# Patient Record
Sex: Male | Born: 1980 | Race: White | Hispanic: No | Smoking: Never smoker
Health system: Southern US, Community
[De-identification: ages and names within clinical notes are randomized; demographics above are authoritative.]

## PROBLEM LIST (undated history)

## (undated) DIAGNOSIS — J320 Chronic maxillary sinusitis: Secondary | ICD-10-CM

## (undated) DIAGNOSIS — J322 Chronic ethmoidal sinusitis: Secondary | ICD-10-CM

## (undated) DIAGNOSIS — J343 Hypertrophy of nasal turbinates: Secondary | ICD-10-CM

## (undated) HISTORY — PX: NO PAST SURGERIES: SHX2092

---

## 2002-01-19 ENCOUNTER — Emergency Department (HOSPITAL_COMMUNITY): Admission: EM | Admit: 2002-01-19 | Discharge: 2002-01-19 | Payer: Self-pay | Admitting: Internal Medicine

## 2002-10-04 ENCOUNTER — Encounter: Payer: Self-pay | Admitting: Emergency Medicine

## 2002-10-04 ENCOUNTER — Emergency Department (HOSPITAL_COMMUNITY): Admission: EM | Admit: 2002-10-04 | Discharge: 2002-10-04 | Payer: Self-pay | Admitting: Emergency Medicine

## 2004-06-21 ENCOUNTER — Emergency Department (HOSPITAL_COMMUNITY): Admission: EM | Admit: 2004-06-21 | Discharge: 2004-06-21 | Payer: Self-pay | Admitting: Emergency Medicine

## 2005-07-31 ENCOUNTER — Emergency Department (HOSPITAL_COMMUNITY): Admission: EM | Admit: 2005-07-31 | Discharge: 2005-07-31 | Payer: Self-pay | Admitting: Emergency Medicine

## 2005-12-29 ENCOUNTER — Emergency Department (HOSPITAL_COMMUNITY): Admission: EM | Admit: 2005-12-29 | Discharge: 2005-12-29 | Payer: Self-pay | Admitting: Emergency Medicine

## 2006-09-09 ENCOUNTER — Emergency Department (HOSPITAL_COMMUNITY): Admission: EM | Admit: 2006-09-09 | Discharge: 2006-09-09 | Payer: Self-pay | Admitting: Emergency Medicine

## 2007-09-16 ENCOUNTER — Emergency Department (HOSPITAL_COMMUNITY): Admission: EM | Admit: 2007-09-16 | Discharge: 2007-09-16 | Payer: Self-pay | Admitting: Emergency Medicine

## 2007-12-11 ENCOUNTER — Emergency Department (HOSPITAL_COMMUNITY): Admission: EM | Admit: 2007-12-11 | Discharge: 2007-12-11 | Payer: Self-pay | Admitting: Emergency Medicine

## 2008-10-31 ENCOUNTER — Emergency Department (HOSPITAL_COMMUNITY): Admission: EM | Admit: 2008-10-31 | Discharge: 2008-10-31 | Payer: Self-pay | Admitting: Emergency Medicine

## 2010-03-31 ENCOUNTER — Emergency Department (HOSPITAL_COMMUNITY): Admission: EM | Admit: 2010-03-31 | Discharge: 2010-03-31 | Payer: Self-pay | Admitting: Emergency Medicine

## 2010-07-23 ENCOUNTER — Emergency Department (HOSPITAL_COMMUNITY)
Admission: EM | Admit: 2010-07-23 | Discharge: 2010-07-23 | Payer: Self-pay | Source: Home / Self Care | Admitting: Emergency Medicine

## 2010-08-05 ENCOUNTER — Emergency Department (HOSPITAL_COMMUNITY): Admission: EM | Admit: 2010-08-05 | Discharge: 2010-08-05 | Payer: Self-pay | Admitting: Emergency Medicine

## 2010-11-18 ENCOUNTER — Emergency Department (HOSPITAL_COMMUNITY): Payer: BC Managed Care – PPO

## 2010-11-18 ENCOUNTER — Emergency Department (HOSPITAL_COMMUNITY)
Admission: EM | Admit: 2010-11-18 | Discharge: 2010-11-18 | Disposition: A | Discharge status: Home / Self Care | Payer: BC Managed Care – PPO | Attending: Emergency Medicine | Admitting: Emergency Medicine

## 2010-11-18 DIAGNOSIS — R071 Chest pain on breathing: Secondary | ICD-10-CM | POA: Insufficient documentation

## 2011-01-14 ENCOUNTER — Emergency Department (HOSPITAL_COMMUNITY)
Admission: EM | Admit: 2011-01-14 | Discharge: 2011-01-14 | Disposition: A | Discharge status: Home / Self Care | Payer: BC Managed Care – PPO | Attending: Emergency Medicine | Admitting: Emergency Medicine

## 2011-01-14 DIAGNOSIS — L255 Unspecified contact dermatitis due to plants, except food: Secondary | ICD-10-CM | POA: Insufficient documentation

## 2011-01-14 DIAGNOSIS — T622X1A Toxic effect of other ingested (parts of) plant(s), accidental (unintentional), initial encounter: Secondary | ICD-10-CM | POA: Insufficient documentation

## 2011-09-16 ENCOUNTER — Emergency Department (HOSPITAL_COMMUNITY)
Admission: EM | Admit: 2011-09-16 | Discharge: 2011-09-16 | Disposition: A | Discharge status: Home / Self Care | Payer: BC Managed Care – PPO | Attending: Emergency Medicine | Admitting: Emergency Medicine

## 2011-09-16 ENCOUNTER — Encounter: Payer: Self-pay | Admitting: *Deleted

## 2011-09-16 DIAGNOSIS — S61409A Unspecified open wound of unspecified hand, initial encounter: Secondary | ICD-10-CM | POA: Insufficient documentation

## 2011-09-16 DIAGNOSIS — IMO0002 Reserved for concepts with insufficient information to code with codable children: Secondary | ICD-10-CM

## 2011-09-16 DIAGNOSIS — W268XXA Contact with other sharp object(s), not elsewhere classified, initial encounter: Secondary | ICD-10-CM | POA: Insufficient documentation

## 2011-09-16 MED ORDER — SULFAMETHOXAZOLE-TRIMETHOPRIM 800-160 MG PO TABS: 1.0000 | ORAL_TABLET | Freq: Two times a day (BID) | ORAL | Status: AC

## 2011-09-16 NOTE — ED Notes (Signed)
Lac to rt hand on Sunday when working on car.

## 2011-09-16 NOTE — ED Notes (Signed)
Dc to home

## 2011-09-17 NOTE — ED Provider Notes (Signed)
Medical screening examination/treatment/procedure(s) were performed by non-physician practitioner and as supervising physician I was immediately available for consultation/collaboration.  Nicholes Stairs, MD 09/17/11 [phone]

## 2011-09-17 NOTE — ED Provider Notes (Signed)
History     CSN: [id number]  Arrival date & time 09/16/11  1214   First MD Initiated Contact with Patient 09/16/11 1313      Chief Complaint  Patient presents with  . Laceration    (Consider location/radiation/quality/duration/timing/severity/associated sxs/prior treatment) Patient is a 31 y.o. male presenting with skin laceration. The history is provided by the patient.  Laceration  The incident occurred 2 days ago. The laceration is located on the right hand. The laceration is 3 cm in size. Injury mechanism: He cut his hand on a sharp car motor edge while working on his car. The pain is at a severity of 2/10. The pain is mild. The pain has been constant (He presents due to increased swelling of the area around this laceration) since onset. He reports no foreign bodies present. His tetanus status is UTD.    History reviewed. No pertinent past medical history.  History reviewed. No pertinent past surgical history.  Family History  Problem Relation Age of Onset  . Diabetes Father     History  Substance Use Topics  . Smoking status: Never Smoker   . Smokeless tobacco: Not on file  . Alcohol Use: Yes      Review of Systems  Constitutional: Negative for fever.  HENT: Negative for congestion, sore throat and neck pain.   Eyes: Negative.   Respiratory: Negative for chest tightness and shortness of breath.   Cardiovascular: Negative for chest pain.  Gastrointestinal: Negative for nausea and abdominal pain.  Genitourinary: Negative.   Musculoskeletal: Negative for joint swelling and arthralgias.  Skin: Positive for color change and wound. Negative for rash.  Neurological: Negative for dizziness, weakness, light-headedness, numbness and headaches.  Hematological: Negative.   Psychiatric/Behavioral: Negative.     Allergies  Review of patient's allergies indicates no known allergies.  Home Medications   Current Outpatient Rx  Name Route Sig Dispense Refill  .  ACETAMINOPHEN 500 MG PO TABS Oral Take 1,000 mg by mouth every 6 (six) hours as needed. For pain     . SULFAMETHOXAZOLE-TRIMETHOPRIM 800-160 MG PO TABS Oral Take 1 tablet by mouth 2 (two) times daily. 20 tablet 0    BP 125/72  Pulse 83  Temp(Src) 97.9 F (36.6 C) (Oral)  Resp 20  Ht 6' (1.829 m)  Wt 170 lb (77.111 kg)  BMI 23.06 kg/m2  SpO2 100%  Physical Exam  Nursing note and vitals reviewed. Constitutional: He is oriented to person, place, and time. He appears well-developed and well-nourished.  HENT:  Head: Normocephalic and atraumatic.  Eyes: Conjunctivae are normal.  Neck: Neck supple.  Cardiovascular: Normal rate and intact distal pulses.   Pulmonary/Chest: Effort normal. No respiratory distress.  Musculoskeletal: Normal range of motion.  Neurological: He is alert and oriented to person, place, and time.  Skin: Skin is warm and dry. There is erythema.       3 cm irregular subcutaneous laceration right dorsal hand near base of 4th finger,  Relatively well approximated when pt does not flex fingers.  He displays full flexion and extension of fingers without difficulty or weakness.  Slight edema and faint erythema 3 cm surrounding laceration consistent with possible early hand infection.  Psychiatric: He has a normal mood and affect.    ED Course  LACERATION REPAIR Performed by: Ronnie Doo L Authorized by: Candis Musa Consent: Verbal consent obtained. Risks and benefits: risks, benefits and alternatives were discussed Consent given by: patient Body area: upper extremity Location details: right hand  Laceration length: 3 cm Foreign bodies: no foreign bodies Tendon involvement: none Vascular damage: no Irrigation solution: saline (Wound cleansing spray also used) Amount of cleaning: standard Debridement: none Skin closure: Steri-Strips Approximation: close Dressing: 4x4 sterile gauze Patient tolerance: Patient tolerated the procedure well with no immediate  complications. Comments: Finger splint applied to volar 4th digit to prevent stress to wound edges.  Patient advised to return in 2 days for recheck if sx are not improving or if he develops worse swelling,  Redness,  Streaking,  Etc.   (including critical care time)  Labs Reviewed - No data to display No results found.   1. Laceration       MDM    Septra Ds prescribed.      Candis Musa, PA 09/17/11 0629  Candis Musa, PA 09/17/11 [phone]

## 2014-06-27 ENCOUNTER — Emergency Department (HOSPITAL_COMMUNITY)
Admission: EM | Admit: 2014-06-27 | Discharge: 2014-06-27 | Disposition: A | Discharge status: Home / Self Care | Payer: BC Managed Care – PPO | Attending: Emergency Medicine | Admitting: Emergency Medicine

## 2014-06-27 ENCOUNTER — Encounter (HOSPITAL_COMMUNITY): Payer: Self-pay | Admitting: Emergency Medicine

## 2014-06-27 DIAGNOSIS — Z7952 Long term (current) use of systemic steroids: Secondary | ICD-10-CM | POA: Diagnosis not present

## 2014-06-27 DIAGNOSIS — B356 Tinea cruris: Secondary | ICD-10-CM | POA: Insufficient documentation

## 2014-06-27 DIAGNOSIS — Z79899 Other long term (current) drug therapy: Secondary | ICD-10-CM | POA: Insufficient documentation

## 2014-06-27 DIAGNOSIS — R21 Rash and other nonspecific skin eruption: Secondary | ICD-10-CM | POA: Diagnosis present

## 2014-06-27 MED ORDER — CLOTRIMAZOLE-BETAMETHASONE 1-0.05 % EX LOTN: TOPICAL_LOTION | Freq: Two times a day (BID) | CUTANEOUS | Status: DC

## 2014-06-27 NOTE — ED Notes (Signed)
Pt with rash that comes and goes for months, pt does admit to switching detergents frequently, states area burns

## 2014-06-27 NOTE — ED Provider Notes (Signed)
CSN: [id number]     Arrival date & time 06/27/14  1614 History   First MD Initiated Contact with Patient 06/27/14 1634     Chief Complaint  Patient presents with  . Rash     (Consider location/radiation/quality/duration/timing/severity/associated sxs/prior Treatment) Patient is a 33 y.o. male presenting with rash. The history is provided by the patient.  Rash Location:  Ano-genital Ano-genital rash location:  Groin Quality: burning, itchiness, redness, scaling and weeping   Quality: not blistering, not draining and not painful   Severity:  Moderate Onset quality:  Gradual Duration:  3 days Timing:  Constant Progression:  Spreading Chronicity:  Recurrent Context: not chemical exposure, not exposure to similar rash, not hot tub use, not medications and not new detergent/soap   Relieved by:  Nothing Worsened by:  Heat Ineffective treatments:  Topical steroids Associated symptoms: no abdominal pain, no fever, no headaches, no nausea, no shortness of breath, no sore throat and not wheezing     History reviewed. No pertinent past medical history. History reviewed. No pertinent past surgical history. Family History  Problem Relation Age of Onset  . Diabetes Father    History  Substance Use Topics  . Smoking status: Never Smoker   . Smokeless tobacco: Not on file  . Alcohol Use: Yes    Review of Systems  Constitutional: Negative for fever and chills.  HENT: Negative for sore throat.   Respiratory: Negative for shortness of breath and wheezing.   Gastrointestinal: Negative for nausea and abdominal pain.  Skin: Positive for rash.  Neurological: Negative for numbness and headaches.      Allergies  Review of patient's allergies indicates no known allergies.  Home Medications   Prior to Admission medications   Medication Sig Start Date End Date Taking? Authorizing Provider  triamcinolone cream (KENALOG) 0.1 % Apply 1 application topically daily. 06/07/14  Yes Historical  Provider, MD  clotrimazole-betamethasone (LOTRISONE) lotion Apply topically 2 (two) times daily. 06/27/14   Burgess AmorJulie Breckon Reeves, PA-C   BP 128/82  Pulse 88  Temp(Src) 98.2 F (36.8 C) (Oral)  Resp 20  Ht 6' (1.829 m)  Wt 179 lb (81.194 kg)  BMI 24.27 kg/m2  SpO2 100% Physical Exam  Constitutional: He appears well-developed and well-nourished. No distress.  HENT:  Head: Normocephalic.  Neck: Neck supple.  Cardiovascular: Normal rate.   Pulmonary/Chest: Effort normal. He has no wheezes.  Musculoskeletal: Normal range of motion. He exhibits no edema.  Skin: Rash noted.  Erythematous rash, raised,  Papular, scaly,  satellite lesions bilateral upper medial thighs.    ED Course  Procedures (including critical care time) Labs Review Labs Reviewed - No data to display  Imaging Review No results found.   EKG Interpretation None      MDM   Final diagnoses:  Tinea cruris    PT is currently using triamcinolone cream for a rash on his hands suspected to be eczema.  He has tried this on his new groin rash which has not improved,  States he feels it makes it Freight forwarderitchier.  Suspect fungal infection.  lotrisone lotion.  Plan f/u with pcp for a recheck if not starting to improve over the next week.    Burgess AmorJulie Jaymarion Trombly, PA-C 06/27/14 [phone]

## 2014-06-27 NOTE — ED Provider Notes (Signed)
Medical screening examination/treatment/procedure(s) were performed by non-physician practitioner and as supervising physician I was immediately available for consultation/collaboration.   EKG Interpretation None       Wynne Jury R. Elona Yinger, MD 06/27/14 2344 

## 2014-06-27 NOTE — Discharge Instructions (Signed)
Jock Itch Jock itch is a fungal infection of the skin in the groin area. It is sometimes called "ringworm" even though it is not caused by a worm. A fungus is a type of germ that thrives in dark, damp places.  CAUSES  This infection may spread from:  A fungus infection elsewhere on the body (such as athlete's foot).  Sharing towels or clothing. This infection is more common in:  Hot, humid climates.  People who wear tight-fitting clothing or wet bathing suits for long periods of time.  Athletes.  Overweight people.  People with diabetes. SYMPTOMS  Jock itch causes the following symptoms:  Red, pink or brown rash in the groin. Rash may spread to the thighs, anus, and buttocks.  Itching. DIAGNOSIS  Your caregiver may make the diagnosis by looking at the rash. Sometimes a skin scraping will be sent to test for fungus. Testing can be done either by looking under the microscope or by doing a culture (test to try to grow the fungus). A culture can take up to 2 weeks to come back. TREATMENT  Jock itch may be treated with:  Skin cream or ointment to kill fungus.  Medicine by mouth to kill fungus.  Skin cream or ointment to calm the itching.  Compresses or medicated powders to dry the infected skin. HOME CARE INSTRUCTIONS   Be sure to treat the rash completely. Follow your caregiver's instructions. It can take a couple of weeks to treat. If you do not treat the infection long enough, the rash can come back.  Wear loose-fitting clothing.  Men should wear cotton boxer shorts.  Women should wear cotton underwear.  Avoid hot baths.  Dry the groin area well after bathing. SEEK MEDICAL CARE IF:   Your rash is worse.  Your rash is spreading.  Your rash returns after treatment is finished.  Your rash is not gone in 4 weeks. Fungal infections are slow to respond to treatment. Some redness may remain for several weeks after the fungus is gone. SEEK IMMEDIATE MEDICAL CARE  IF:  The area becomes red, warm, tender, and swollen.  You have a fever. Document Released: 08/15/2002 Document Revised: 11/17/2011 Document Reviewed: 07/14/2008 Exit[employer] Patient Information 2015 [employer]. This information is not intended to replace advice given to you by your health care provider. Make sure you discuss any questions you have with your health care provider.  

## 2014-06-27 NOTE — ED Notes (Signed)
Rash to groin for 2 days

## 2017-08-21 DIAGNOSIS — R509 Fever, unspecified: Secondary | ICD-10-CM | POA: Diagnosis not present

## 2017-08-21 DIAGNOSIS — J0100 Acute maxillary sinusitis, unspecified: Secondary | ICD-10-CM | POA: Diagnosis not present

## 2017-08-21 DIAGNOSIS — J320 Chronic maxillary sinusitis: Secondary | ICD-10-CM | POA: Diagnosis not present

## 2018-01-11 ENCOUNTER — Ambulatory Visit (INDEPENDENT_AMBULATORY_CARE_PROVIDER_SITE_OTHER): Payer: BLUE CROSS/BLUE SHIELD | Admitting: Otolaryngology

## 2018-01-11 DIAGNOSIS — J320 Chronic maxillary sinusitis: Secondary | ICD-10-CM

## 2018-01-11 DIAGNOSIS — J343 Hypertrophy of nasal turbinates: Secondary | ICD-10-CM

## 2018-01-18 ENCOUNTER — Other Ambulatory Visit (INDEPENDENT_AMBULATORY_CARE_PROVIDER_SITE_OTHER): Payer: Self-pay | Admitting: Otolaryngology

## 2018-01-18 DIAGNOSIS — J320 Chronic maxillary sinusitis: Secondary | ICD-10-CM

## 2018-01-29 ENCOUNTER — Ambulatory Visit (HOSPITAL_COMMUNITY)
Admission: RE | Admit: 2018-01-29 | Discharge: 2018-01-29 | Disposition: A | Discharge status: Home / Self Care | Payer: BLUE CROSS/BLUE SHIELD | Source: Ambulatory Visit | Attending: Otolaryngology | Admitting: Otolaryngology

## 2018-01-29 DIAGNOSIS — J320 Chronic maxillary sinusitis: Secondary | ICD-10-CM | POA: Insufficient documentation

## 2018-02-08 ENCOUNTER — Ambulatory Visit (INDEPENDENT_AMBULATORY_CARE_PROVIDER_SITE_OTHER): Payer: BLUE CROSS/BLUE SHIELD | Admitting: Otolaryngology

## 2018-02-25 ENCOUNTER — Ambulatory Visit (INDEPENDENT_AMBULATORY_CARE_PROVIDER_SITE_OTHER): Payer: BLUE CROSS/BLUE SHIELD | Admitting: Otolaryngology

## 2018-02-25 DIAGNOSIS — J320 Chronic maxillary sinusitis: Secondary | ICD-10-CM

## 2018-02-25 DIAGNOSIS — J343 Hypertrophy of nasal turbinates: Secondary | ICD-10-CM | POA: Diagnosis not present

## 2018-02-25 DIAGNOSIS — J322 Chronic ethmoidal sinusitis: Secondary | ICD-10-CM | POA: Diagnosis not present

## 2018-02-26 ENCOUNTER — Other Ambulatory Visit: Payer: Self-pay | Admitting: Otolaryngology

## 2018-03-08 DIAGNOSIS — J320 Chronic maxillary sinusitis: Secondary | ICD-10-CM

## 2018-03-08 DIAGNOSIS — J322 Chronic ethmoidal sinusitis: Secondary | ICD-10-CM

## 2018-03-08 DIAGNOSIS — J343 Hypertrophy of nasal turbinates: Secondary | ICD-10-CM

## 2018-03-08 HISTORY — DX: Chronic maxillary sinusitis: J32.0

## 2018-03-08 HISTORY — DX: Chronic ethmoidal sinusitis: J32.2

## 2018-03-08 HISTORY — DX: Hypertrophy of nasal turbinates: J34.3

## 2018-04-05 ENCOUNTER — Encounter (HOSPITAL_BASED_OUTPATIENT_CLINIC_OR_DEPARTMENT_OTHER): Payer: Self-pay | Admitting: *Deleted

## 2018-04-05 ENCOUNTER — Other Ambulatory Visit: Payer: Self-pay

## 2018-04-12 ENCOUNTER — Encounter (HOSPITAL_BASED_OUTPATIENT_CLINIC_OR_DEPARTMENT_OTHER): Payer: Self-pay

## 2018-04-12 ENCOUNTER — Encounter (HOSPITAL_BASED_OUTPATIENT_CLINIC_OR_DEPARTMENT_OTHER)
Admission: RE | Disposition: A | Discharge status: Home / Self Care | Payer: Self-pay | Source: Ambulatory Visit | Attending: Otolaryngology

## 2018-04-12 ENCOUNTER — Ambulatory Visit (HOSPITAL_BASED_OUTPATIENT_CLINIC_OR_DEPARTMENT_OTHER): Payer: BLUE CROSS/BLUE SHIELD | Admitting: Anesthesiology

## 2018-04-12 ENCOUNTER — Other Ambulatory Visit: Payer: Self-pay

## 2018-04-12 ENCOUNTER — Ambulatory Visit (HOSPITAL_BASED_OUTPATIENT_CLINIC_OR_DEPARTMENT_OTHER)
Admission: RE | Admit: 2018-04-12 | Discharge: 2018-04-12 | Disposition: A | Discharge status: Home / Self Care | Payer: BLUE CROSS/BLUE SHIELD | Source: Ambulatory Visit | Attending: Otolaryngology | Admitting: Otolaryngology

## 2018-04-12 DIAGNOSIS — J320 Chronic maxillary sinusitis: Secondary | ICD-10-CM | POA: Diagnosis not present

## 2018-04-12 DIAGNOSIS — J343 Hypertrophy of nasal turbinates: Secondary | ICD-10-CM | POA: Diagnosis not present

## 2018-04-12 DIAGNOSIS — J322 Chronic ethmoidal sinusitis: Secondary | ICD-10-CM | POA: Diagnosis not present

## 2018-04-12 DIAGNOSIS — J329 Chronic sinusitis, unspecified: Secondary | ICD-10-CM | POA: Diagnosis not present

## 2018-04-12 DIAGNOSIS — J328 Other chronic sinusitis: Secondary | ICD-10-CM | POA: Diagnosis not present

## 2018-04-12 DIAGNOSIS — J338 Other polyp of sinus: Secondary | ICD-10-CM | POA: Diagnosis not present

## 2018-04-12 HISTORY — PX: SINUS ENDO WITH FUSION: SHX5329

## 2018-04-12 HISTORY — DX: Chronic maxillary sinusitis: J32.0

## 2018-04-12 HISTORY — PX: TURBINATE REDUCTION: SHX6157

## 2018-04-12 HISTORY — DX: Chronic ethmoidal sinusitis: J32.2

## 2018-04-12 HISTORY — PX: NASAL SINUS SURGERY: SHX719

## 2018-04-12 HISTORY — DX: Hypertrophy of nasal turbinates: J34.3

## 2018-04-12 SURGERY — REDUCTION, NASAL TURBINATE
Anesthesia: General | Site: Nose | Laterality: Right

## 2018-04-12 MED ORDER — SCOPOLAMINE 1 MG/3DAYS TD PT72: 1.0000 | MEDICATED_PATCH | Freq: Once | TRANSDERMAL | Status: DC | PRN

## 2018-04-12 MED ORDER — PROPOFOL 10 MG/ML IV BOLUS
INTRAVENOUS | Status: AC
  Filled 2018-04-12: qty 20

## 2018-04-12 MED ORDER — OXYMETAZOLINE HCL 0.05 % NA SOLN
NASAL | Status: DC | PRN
  Administered 2018-04-12: 1 via TOPICAL

## 2018-04-12 MED ORDER — ROCURONIUM BROMIDE 100 MG/10ML IV SOLN
INTRAVENOUS | Status: DC | PRN
  Administered 2018-04-12: 40 mg via INTRAVENOUS

## 2018-04-12 MED ORDER — MIDAZOLAM HCL 2 MG/2ML IJ SOLN
INTRAMUSCULAR | Status: AC
  Filled 2018-04-12: qty 2

## 2018-04-12 MED ORDER — MIDAZOLAM HCL 2 MG/2ML IJ SOLN
1.0000 mg | INTRAMUSCULAR | Status: DC | PRN
  Administered 2018-04-12: 2 mg via INTRAVENOUS

## 2018-04-12 MED ORDER — FENTANYL CITRATE (PF) 100 MCG/2ML IJ SOLN
INTRAMUSCULAR | Status: AC
  Filled 2018-04-12: qty 2

## 2018-04-12 MED ORDER — LACTATED RINGERS IV SOLN
INTRAVENOUS | Status: DC
  Administered 2018-04-12 (×2): via INTRAVENOUS

## 2018-04-12 MED ORDER — DEXAMETHASONE SODIUM PHOSPHATE 4 MG/ML IJ SOLN
INTRAMUSCULAR | Status: DC | PRN
  Administered 2018-04-12: 10 mg via INTRAVENOUS

## 2018-04-12 MED ORDER — CEFAZOLIN SODIUM-DEXTROSE 2-3 GM-%(50ML) IV SOLR
INTRAVENOUS | Status: DC | PRN
  Administered 2018-04-12: 2 g via INTRAVENOUS

## 2018-04-12 MED ORDER — ONDANSETRON HCL 4 MG/2ML IJ SOLN
INTRAMUSCULAR | Status: DC | PRN
  Administered 2018-04-12: 4 mg via INTRAVENOUS

## 2018-04-12 MED ORDER — PROMETHAZINE HCL 25 MG/ML IJ SOLN: 6.2500 mg | INTRAMUSCULAR | Status: DC | PRN

## 2018-04-12 MED ORDER — SUGAMMADEX SODIUM 200 MG/2ML IV SOLN
INTRAVENOUS | Status: DC | PRN
  Administered 2018-04-12: 200 mg via INTRAVENOUS

## 2018-04-12 MED ORDER — PROPOFOL 10 MG/ML IV BOLUS
INTRAVENOUS | Status: DC | PRN
  Administered 2018-04-12: 200 mg via INTRAVENOUS

## 2018-04-12 MED ORDER — CEFAZOLIN SODIUM-DEXTROSE 2-4 GM/100ML-% IV SOLN
INTRAVENOUS | Status: AC
  Filled 2018-04-12: qty 100

## 2018-04-12 MED ORDER — ONDANSETRON HCL 4 MG/2ML IJ SOLN
INTRAMUSCULAR | Status: AC
  Filled 2018-04-12: qty 2

## 2018-04-12 MED ORDER — OXYCODONE-ACETAMINOPHEN 5-325 MG PO TABS: 1.0000 | ORAL_TABLET | ORAL | 0 refills | Status: AC | PRN

## 2018-04-12 MED ORDER — LIDOCAINE HCL (CARDIAC) PF 100 MG/5ML IV SOSY
PREFILLED_SYRINGE | INTRAVENOUS | Status: DC | PRN
  Administered 2018-04-12: 100 mg via INTRAVENOUS

## 2018-04-12 MED ORDER — OXYCODONE HCL 5 MG PO TABS: 5.0000 mg | ORAL_TABLET | Freq: Once | ORAL | Status: DC | PRN

## 2018-04-12 MED ORDER — DEXAMETHASONE SODIUM PHOSPHATE 10 MG/ML IJ SOLN
INTRAMUSCULAR | Status: AC
  Filled 2018-04-12: qty 1

## 2018-04-12 MED ORDER — OXYCODONE HCL 5 MG/5ML PO SOLN: 5.0000 mg | Freq: Once | ORAL | Status: DC | PRN

## 2018-04-12 MED ORDER — AMOXICILLIN 875 MG PO TABS: 875.0000 mg | ORAL_TABLET | Freq: Two times a day (BID) | ORAL | 0 refills | Status: AC

## 2018-04-12 MED ORDER — FENTANYL CITRATE (PF) 100 MCG/2ML IJ SOLN: 25.0000 ug | INTRAMUSCULAR | Status: DC | PRN

## 2018-04-12 MED ORDER — MEPERIDINE HCL 25 MG/ML IJ SOLN: 6.2500 mg | INTRAMUSCULAR | Status: DC | PRN

## 2018-04-12 MED ORDER — FENTANYL CITRATE (PF) 100 MCG/2ML IJ SOLN
50.0000 ug | INTRAMUSCULAR | Status: DC | PRN
  Administered 2018-04-12: 50 ug via INTRAVENOUS
  Administered 2018-04-12: 100 ug via INTRAVENOUS

## 2018-04-12 SURGICAL SUPPLY — 52 items
ATTRACTOMAT 16X20 MAGNETIC DRP (DRAPES) IMPLANT
BLADE RAD40 ROTATE 4M 4 5PK (BLADE) IMPLANT
BLADE RAD60 ROTATE M4 4 5PK (BLADE) IMPLANT
BLADE ROTATE RAD 12 4 M4 (BLADE) IMPLANT
BLADE ROTATE RAD 40 4 M4 (BLADE) IMPLANT
BLADE ROTATE TRICUT 4X13 M4 (BLADE) ×2 IMPLANT
BLADE TRICUT ROTATE M4 4 5PK (BLADE) IMPLANT
BUR HS RAD FRONTAL 3 (BURR) IMPLANT
CANISTER SUC SOCK COL 7IN (MISCELLANEOUS) ×4 IMPLANT
CANISTER SUCT 1200ML W/VALVE (MISCELLANEOUS) ×2 IMPLANT
COAGULATOR SUCT 8FR VV (MISCELLANEOUS) IMPLANT
COAGULATOR SUCT SWTCH 10FR 6 (ELECTROSURGICAL) ×1 IMPLANT
DECANTER SPIKE VIAL GLASS SM (MISCELLANEOUS) IMPLANT
DRSG NASAL KENNEDY LMNT 8CM (GAUZE/BANDAGES/DRESSINGS) IMPLANT
DRSG NASOPORE 8CM (GAUZE/BANDAGES/DRESSINGS) IMPLANT
DRSG TELFA 3X8 NADH (GAUZE/BANDAGES/DRESSINGS) ×2 IMPLANT
ELECT REM PT RETURN 9FT ADLT (ELECTROSURGICAL) ×2
ELECTRODE REM PT RTRN 9FT ADLT (ELECTROSURGICAL) ×1 IMPLANT
GLOVE BIO SURGEON STRL SZ7.5 (GLOVE) ×2 IMPLANT
GLOVE SURG SS PI 7.0 STRL IVOR (GLOVE) ×1 IMPLANT
GOWN STRL REUS W/ TWL LRG LVL3 (GOWN DISPOSABLE) ×2 IMPLANT
GOWN STRL REUS W/TWL LRG LVL3 (GOWN DISPOSABLE) ×4
HEMOSTAT SURGICEL 2X14 (HEMOSTASIS) IMPLANT
IV NS 1000ML (IV SOLUTION)
IV NS 1000ML BAXH (IV SOLUTION) IMPLANT
IV NS 500ML (IV SOLUTION) ×2
IV NS 500ML BAXH (IV SOLUTION) ×1 IMPLANT
IV SET EXT 30 76VOL 4 MALE LL (IV SETS) IMPLANT
NDL HYPO 25X1 1.5 SAFETY (NEEDLE) ×1 IMPLANT
NDL SPNL 25GX3.5 QUINCKE BL (NEEDLE) IMPLANT
NEEDLE HYPO 25X1 1.5 SAFETY (NEEDLE) IMPLANT
NEEDLE SPNL 25GX3.5 QUINCKE BL (NEEDLE) IMPLANT
NS IRRIG 1000ML POUR BTL (IV SOLUTION) ×2 IMPLANT
PACK BASIN DAY SURGERY FS (CUSTOM PROCEDURE TRAY) ×2 IMPLANT
PACK ENT DAY SURGERY (CUSTOM PROCEDURE TRAY) ×2 IMPLANT
PACKING NASAL EPIS 4X2.4 XEROG (MISCELLANEOUS) IMPLANT
PAD DRESSING TELFA 3X8 NADH (GAUZE/BANDAGES/DRESSINGS) IMPLANT
PATTIES SURGICAL .5 X3 (DISPOSABLE) IMPLANT
SLEEVE SCD COMPRESS KNEE MED (MISCELLANEOUS) ×1 IMPLANT
SOLUTION BUTLER CLEAR DIP (MISCELLANEOUS) ×4 IMPLANT
SPLINT NASAL AIRWAY SILICONE (MISCELLANEOUS) IMPLANT
SPONGE GAUZE 2X2 8PLY STRL LF (GAUZE/BANDAGES/DRESSINGS) ×2 IMPLANT
SPONGE NEURO XRAY DETECT 1X3 (DISPOSABLE) ×2 IMPLANT
SUT ETHILON 3 0 PS 1 (SUTURE) IMPLANT
SUT PLAIN 4 0 ~~LOC~~ 1 (SUTURE) IMPLANT
TOWEL GREEN STERILE FF (TOWEL DISPOSABLE) ×2 IMPLANT
TRACKER ENT INSTRUMENT (MISCELLANEOUS) ×2 IMPLANT
TRACKER ENT PATIENT (MISCELLANEOUS) ×2 IMPLANT
TUBE CONNECTING 20X1/4 (TUBING) ×2 IMPLANT
TUBE SALEM SUMP 16 FR W/ARV (TUBING) IMPLANT
TUBING STRAIGHTSHOT EPS 5PK (TUBING) ×2 IMPLANT
YANKAUER SUCT BULB TIP NO VENT (SUCTIONS) ×2 IMPLANT

## 2018-04-12 NOTE — Anesthesia Procedure Notes (Signed)
Procedure Name: Intubation Date/Time: 04/12/2018 11:08 AM Performed by: Maryella Shivers, CRNA Pre-anesthesia Checklist: Patient identified, Emergency Drugs available, Suction available and Patient being monitored Patient Re-evaluated:Patient Re-evaluated prior to induction Oxygen Delivery Method: Circle system utilized Preoxygenation: Pre-oxygenation with 100% oxygen Induction Type: IV induction Ventilation: Mask ventilation without difficulty Laryngoscope Size: Mac and 4 Grade View: Grade I Tube type: Oral Tube size: 8.0 mm Number of attempts: 1 Airway Equipment and Method: Stylet and Oral airway Placement Confirmation: ETT inserted through vocal cords under direct vision,  positive ETCO2 and breath sounds checked- equal and bilateral Secured at: 22 cm Tube secured with: Tape Dental Injury: Teeth and Oropharynx as per pre-operative assessment

## 2018-04-12 NOTE — H&P (Signed)
Cc: Right sided chronic sinusitis, nasal congestion  HPI: The patient is a 37 year old male who returns today for his follow-up evaluation.  The patient was last seen 1 month ago.  At that time, he was noted to have chronic right maxillary sinusitis with purulent drainage noted within the right nasal cavity.  His infection started after a right maxillary molar extraction in 07/2017.  The patient was treated with multiple courses of antibiotics and steroids.  However, he continues to be symptomatic.  The patient returns today complaining of persistent right-sided nasal congestion and facial pain/pressure.  His most recent CT scan showed complete opacification of the right maxillary sinus, with partial opacification of the right ethmoid sinus.  His right inferior turbinate is also severely hypertrophied. No other ENT, GI, or respiratory issue noted since the last visit.   Exam: General: Communicates without difficulty, well nourished, no acute distress. Head: Normocephalic, no evidence injury, no tenderness, facial buttresses intact without stepoff. Face/sinus: No tenderness to palpation and percussion. Facial movement is normal and symmetric. Eyes: PERRL, EOMI. No scleral icterus, conjunctivae clear. Neuro: CN II exam reveals vision grossly intact.  No nystagmus at any point of gaze. Ears: Auricles well formed without lesions.  Ear canals are intact without mass or lesion.  No erythema or edema is appreciated.  The TMs are intact without fluid. Nose: External evaluation reveals normal support and skin without lesions.  Dorsum is intact.  Anterior rhinoscopy reveals congested mucosa over anterior aspect of inferior turbinates and intact septum. Severe right inferior turbinate hypertrophy.  Oral:  Oral cavity and oropharynx are intact, symmetric, without erythema or edema.  Mucosa is moist without lesions. Neck: Full range of motion without pain.  There is no significant lymphadenopathy.  No masses palpable.   Thyroid bed within normal limits to palpation.  Parotid glands and submandibular glands equal bilaterally without mass.  Trachea is midline. Neuro:  CN 2-12 grossly intact. Gait normal.   Assessment  1.  Chronic right maxillary and ethmoid sinusitis.  2.  Severe right inferior turbinate hypertrophy.  3.  No obvious oroantral fistula was noted on his CT scan.   Plan 1.  The physical exam findings and the CT images are extensively reviewed with the patient.  2.  Since the patient has not responded to medical treatment, he will benefit from surgical intervention with right maxillary antrostomy and total ethmoidectomy. He will also benefit from undergoing right inferior turbinate reduction to improve his right nasal passageway.  3.  The risks, benefits, and details of the procedure are reviewed with the patient.  Questions are invited and answered.  4.  The patient would like to proceed with the procedures.

## 2018-04-12 NOTE — Transfer of Care (Signed)
Immediate Anesthesia Transfer of Care Note  Patient: Juan ModyBrandon C Herbert  Procedure(s) Performed: RIGHT TURBINATE REDUCTION (Right Nose) RIGHT ENDOSCOPIC ETHMOIDECTOMY AND MAXILLARY ANTROSTOMY WITH TISSUE REMOVAL (Right Nose) SINUS ENDO WITH FUSION (Right Nose)  Patient Location: PACU  Anesthesia Type:General  Level of Consciousness: sedated  Airway & Oxygen Therapy: Patient Spontanous Breathing and Patient connected to face mask oxygen  Post-op Assessment: Report given to RN and Post -op Vital signs reviewed and stable  Post vital signs: Reviewed and stable  Last Vitals:  Vitals Value Taken Time  BP    Temp    Pulse 82 04/12/2018 12:26 PM  Resp 15 04/12/2018 12:26 PM  SpO2 100 % 04/12/2018 12:26 PM  Vitals shown include unvalidated device data.  Last Pain:  Vitals:   04/12/18 0921  TempSrc: Oral  PainSc: 0-No pain         Complications: No apparent anesthesia complications

## 2018-04-12 NOTE — Op Note (Signed)
DATE OF PROCEDURE: 04/12/2018  OPERATIVE REPORT   SURGEON: Newman PiesSu Andersson Larrabee, MD   PREOPERATIVE DIAGNOSES:  1. Chronic right maxillary and ethmoid sinusitis 2. Right inferior turbinate hypertrophy.   POSTOPERATIVE DIAGNOSES:  1. Chronic right maxillary and ethmoid sinusitis 2. Right inferior turbinate hypertrophy.   PROCEDURE PERFORMED:  1. Endoscopic right total ethmoidectomy 2. Endoscopic right maxillary antrostomy with polyp removal 3. Right partial inferior turbinate resection 4. FUSION stereotactic image guidance  ANESTHESIA: General endotracheal tube anesthesia.   COMPLICATIONS: None.   ESTIMATED BLOOD LOSS: 200 mL.   INDICATION FOR PROCEDURE: Meredith ModyBrandon C Agyeman is a 37 y.o. male with a history of chronic right-sided rhinosinusitis. The patient has been experiencing persistent right-sided nasal congestion and facial pain/pressure. He has also noted frequent purulent drainage from his right nasal cavity. His infection started after a right maxillary molar extraction in November 2018. He was treated with multiple courses of antibiotics and steroid. On his CT scan, he was noted to have complete opacification of the right maxillary sinus, with partial opacification of the right ethmoid sinus. His right inferior turbinate was severely hypertrophied. Based on the above findings, the decision was made for the patient to undergo the above-stated procedures. The risks, benefits, alternatives, and details of the procedures were discussed with the patient. Questions were invited and answered. Informed consent was obtained.   DESCRIPTION OF PROCEDURE: The patient was taken to the operating room and placed supine on the operating table. General endotracheal tube anesthesia was administered by the anesthesiologist. The patient was positioned, and prepped and draped in the standard fashion for nasal surgery. Pledgets soaked with Afrin were placed in both nasal cavities for decongestion. The pledgets were  subsequently removed.   The FUSION stereotactic image guidance marker was placed. The image guidance system was functional throughout the case. Using a 0 endoscope, the nasal cavities examined. The left nasal cavity was noted to be normal with no polyps, mass, or lesion. Examination of the right nasal cavity revealed polypoid tissue obstructing the right middle meatus. Purulent drainage was also noted. The right inferior turbinate was severely hypertrophied.  The inferior one half of the right hypertrophied inferior turbinate was crossclamped with a Kelly clamp. The inferior one half of inferior turbinate was then resected with a pair of cross cutting scissors. Hemostasis was achieved with a suction cautery device.   Attention was then focused on the right maxillary and ethmoid sinuses. The right middle turbinate was carefully medialized. Polypoid tissue was removed from the right middle meatus using a combination of microdebrider and Blakesley forceps. The uncinate process was resected with a freer elevator. The maxillary antrum was entered and enlarged using a combination of backbiting forceps and Tru-Cut forceps. Polypoid tissue was removed from the right maxillary sinus. A large amount of purulent fluid was also suctioned. The bony partitions off the anterior and posterior ethmoid cavities were then removed using a Tru-Cut forceps. Polypoid tissue was also removed. The maxillary and ethmoid cavities were copiously irrigated. Nasopore packing was placed.  The care of the patient was turned over to the anesthesiologist. The patient was awakened from anesthesia without difficulty. The patient was extubated and transferred to the recovery room in good condition.   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 Severe right inferior turbinate hypertrophy.  SPECIMEN: Right sinus contents.  FOLLOWUP CARE: The patient be discharged home once he is awake and alert. The  patient will be placed on Percocet 1 tablets p.o. q.4 hours p.r.n. pain,  and amoxicillin 875 mg p.o. b.i.d. for 5 days. The patient will follow up in my office in approximately 1 week.  Nike Southwell Philomena Doheny, MD

## 2018-04-12 NOTE — Anesthesia Preprocedure Evaluation (Signed)
Anesthesia Evaluation  Patient identified by MRN, date of birth, ID band Patient awake    Reviewed: Allergy & Precautions, NPO status , Patient's Chart, lab work & pertinent test results  Airway Mallampati: II  TM Distance: >3 FB Neck ROM: Full    Dental no notable dental hx.    Pulmonary neg pulmonary ROS,    Pulmonary exam normal breath sounds clear to auscultation       Cardiovascular negative cardio ROS Normal cardiovascular exam Rhythm:Regular Rate:Normal     Neuro/Psych negative neurological ROS  negative psych ROS   GI/Hepatic negative GI ROS, Neg liver ROS,   Endo/Other  negative endocrine ROS  Renal/GU negative Renal ROS     Musculoskeletal negative musculoskeletal ROS (+)   Abdominal   Peds  Hematology negative hematology ROS (+)   Anesthesia Other Findings   Reproductive/Obstetrics                             Anesthesia Physical Anesthesia Plan  ASA: II  Anesthesia Plan: General   Post-op Pain Management:    Induction: Intravenous  PONV Risk Score and Plan: 4 or greater and Ondansetron, Dexamethasone and Midazolam  Airway Management Planned: Oral ETT  Additional Equipment:   Intra-op Plan:   Post-operative Plan: Extubation in OR  Informed Consent: I have reviewed the patients History and Physical, chart, labs and discussed the procedure including the risks, benefits and alternatives for the proposed anesthesia with the patient or authorized representative who has indicated his/her understanding and acceptance.   Dental advisory given  Plan Discussed with: CRNA  Anesthesia Plan Comments:         Anesthesia Quick Evaluation

## 2018-04-12 NOTE — Discharge Instructions (Addendum)

## 2018-04-13 ENCOUNTER — Encounter (HOSPITAL_BASED_OUTPATIENT_CLINIC_OR_DEPARTMENT_OTHER): Payer: Self-pay | Admitting: Otolaryngology

## 2018-04-13 NOTE — Anesthesia Postprocedure Evaluation (Signed)
Anesthesia Post Note  Patient: Juan Stewart  Procedure(s) Performed: RIGHT TURBINATE REDUCTION (Right Nose) RIGHT ENDOSCOPIC ETHMOIDECTOMY AND MAXILLARY ANTROSTOMY WITH TISSUE REMOVAL (Right Nose) SINUS ENDO WITH FUSION (Right Nose)     Patient location during evaluation: PACU Anesthesia Type: General Level of consciousness: sedated and patient cooperative Pain management: pain level controlled Vital Signs Assessment: post-procedure vital signs reviewed and stable Respiratory status: spontaneous breathing Cardiovascular status: stable Anesthetic complications: no    Last Vitals:  Vitals:   04/12/18 1330 04/12/18 1355  BP: (!) 149/97 (!) 147/93  Pulse: 81 81  Resp: 14 16  Temp:  37 C  SpO2: 97% 99%    Last Pain:  Vitals:   04/12/18 1355  TempSrc:   PainSc: 0-No pain                 Lewie LoronJohn Jaxn Chiquito

## 2018-04-19 ENCOUNTER — Ambulatory Visit (INDEPENDENT_AMBULATORY_CARE_PROVIDER_SITE_OTHER): Payer: BLUE CROSS/BLUE SHIELD | Admitting: Otolaryngology

## 2018-04-19 DIAGNOSIS — J322 Chronic ethmoidal sinusitis: Secondary | ICD-10-CM | POA: Diagnosis not present

## 2018-04-19 DIAGNOSIS — J320 Chronic maxillary sinusitis: Secondary | ICD-10-CM | POA: Diagnosis not present

## 2018-05-06 ENCOUNTER — Ambulatory Visit (INDEPENDENT_AMBULATORY_CARE_PROVIDER_SITE_OTHER): Payer: BLUE CROSS/BLUE SHIELD | Admitting: Otolaryngology

## 2018-05-06 DIAGNOSIS — J320 Chronic maxillary sinusitis: Secondary | ICD-10-CM | POA: Diagnosis not present

## 2018-05-06 DIAGNOSIS — J322 Chronic ethmoidal sinusitis: Secondary | ICD-10-CM

## 2018-05-20 ENCOUNTER — Ambulatory Visit (INDEPENDENT_AMBULATORY_CARE_PROVIDER_SITE_OTHER): Payer: BLUE CROSS/BLUE SHIELD | Admitting: Otolaryngology

## 2018-07-29 IMAGING — CT CT MAXILLOFACIAL W/O CM
3 series · 14 of 47 positions shown, 16 images · non-contrast
Comparison: Sinus radiographs 08/21/2017

CLINICAL DATA: Chronic maxillary sinusitis.

Interpretation of images was delayed while assigning the appropriate
order.
EXAM:
CT MAXILLOFACIAL WITHOUT CONTRAST
TECHNIQUE: Multidetector CT images of the paranasal sinuses were obtained using
the standard protocol without intravenous contrast.

[Series 2: standard · axial · 0.41mm/px · z∈[-257,-144]mm · 8 of 131 slices shown, 10 images]
[im 9/131  brain]
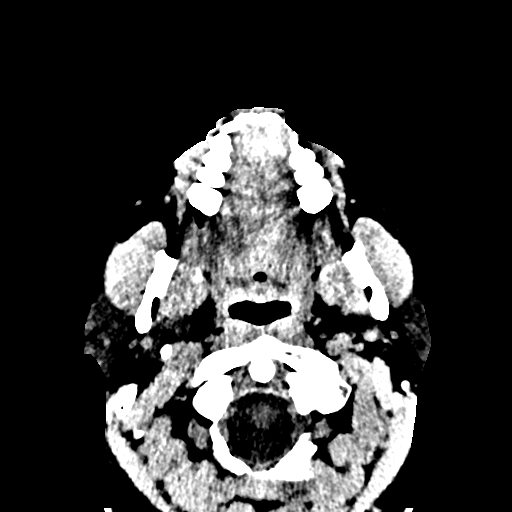
[im 9/131  bone]
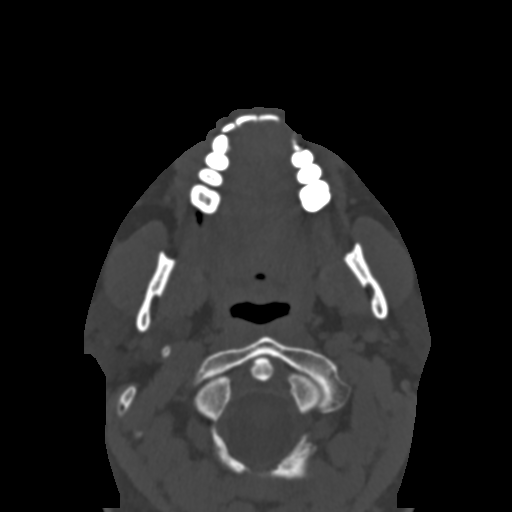
[im 27/131  bone]
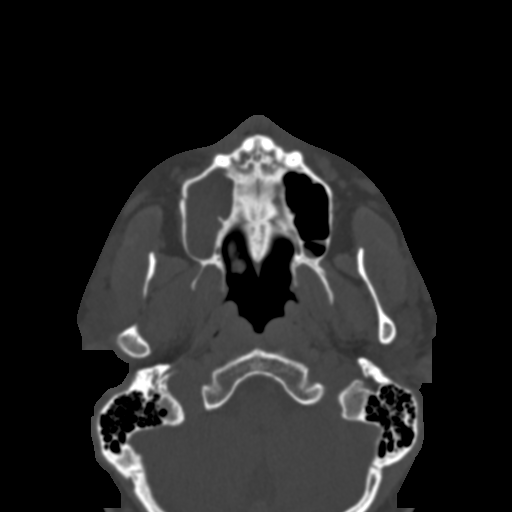
[im 41/131  bone]
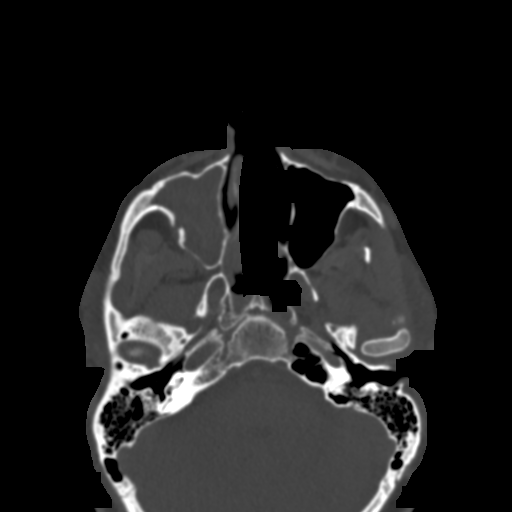
[im 59/131  bone]
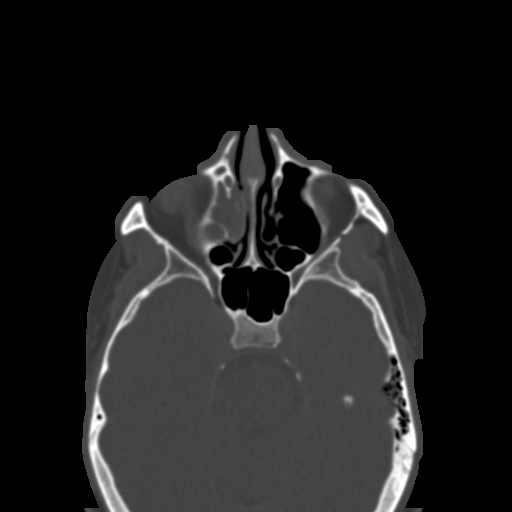
[im 72/131  brain]
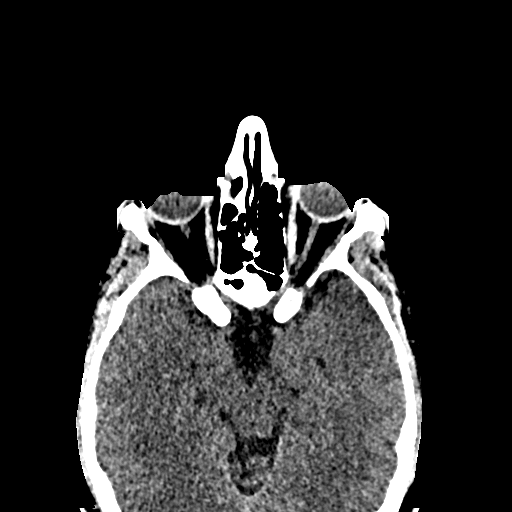
[im 72/131  bone]
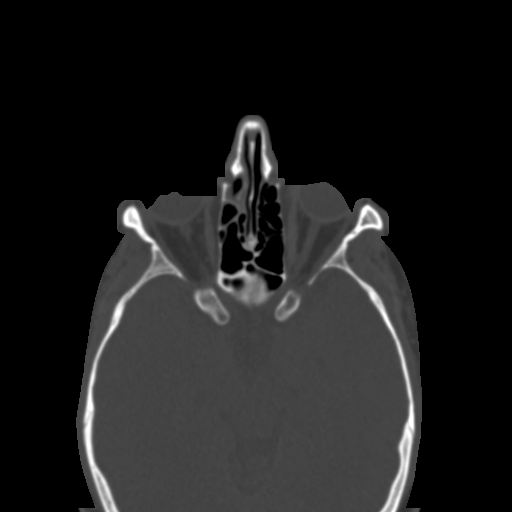
[im 90/131  bone]
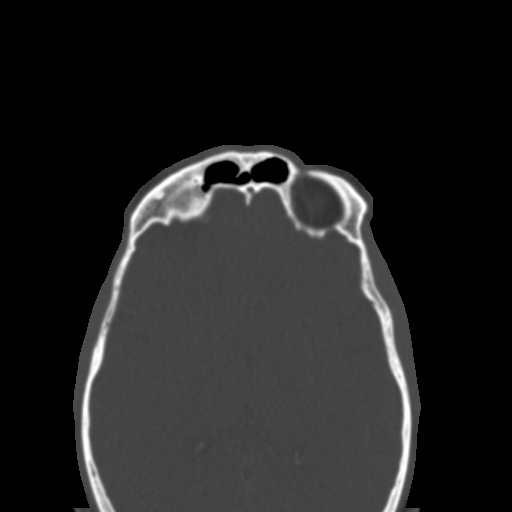
[im 104/131  bone]
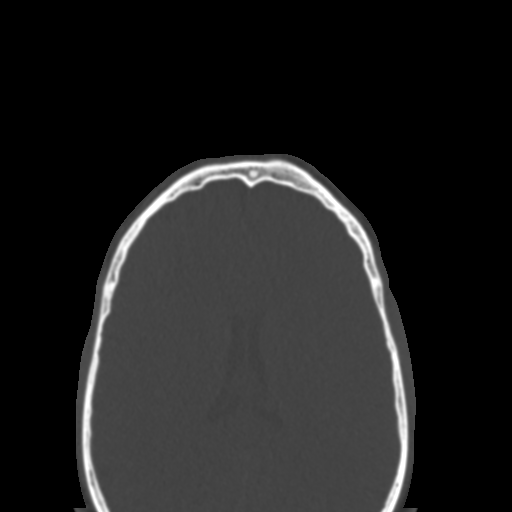
[im 122/131  bone]
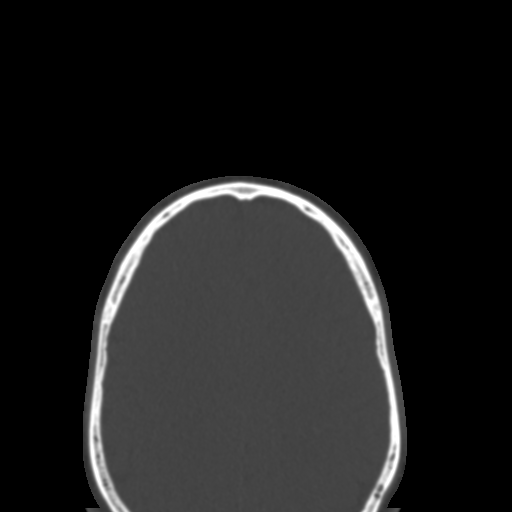

[Series 4: coronal · coronal · 0.25mm/px · 3 of 87 slices shown]
[im 29/87  bone]
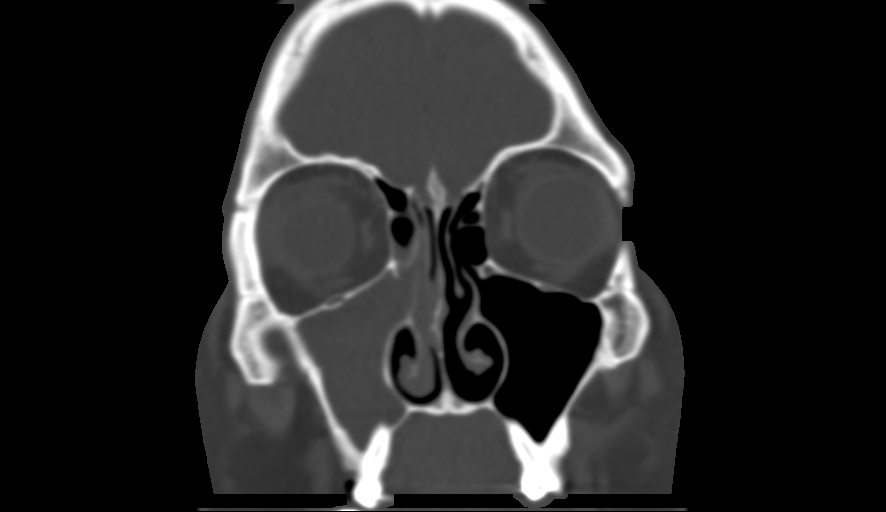
[im 39/87  bone]
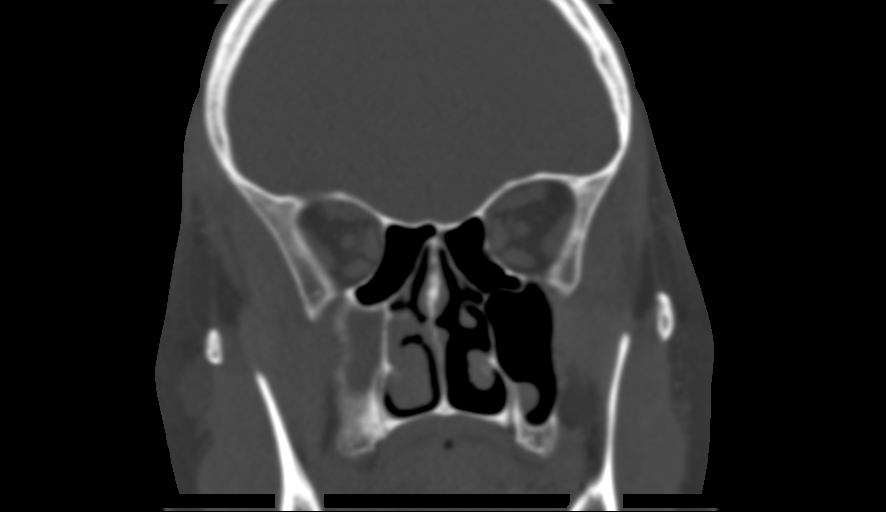
[im 48/87  bone]
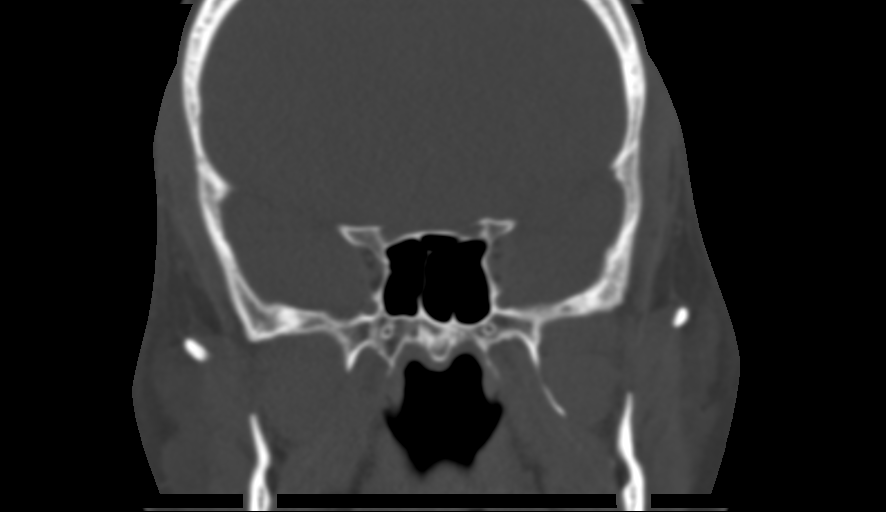

[Series 5: sagittal · sagittal · 0.25mm/px · 3 of 93 slices shown]
[im 31/93  bone]
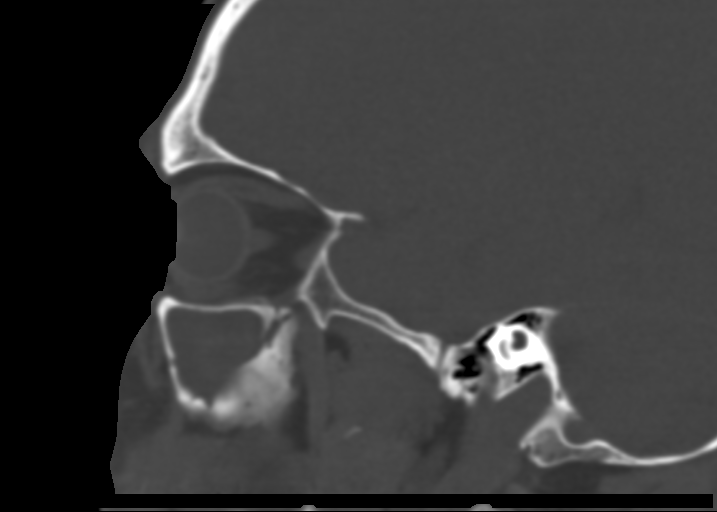
[im 47/93  bone]
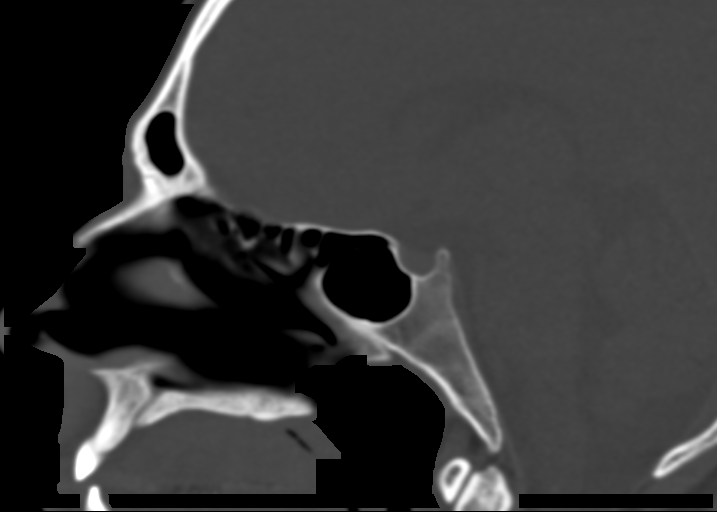
[im 62/93  bone]
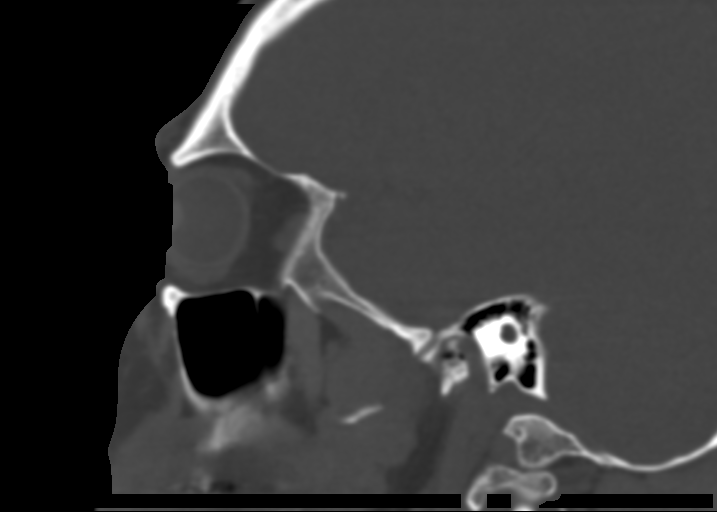

[14 of 47 positions shown; findings below may reference images not displayed]

FINDINGS: Paranasal sinuses:

Frontal: Normally aerated. Patent frontal sinus drainage pathways.

Ethmoid: Anterior right ethmoid air cell mucosal thickening and
opacification is noted.

Maxillary: Chronic right maxillary sinus opacification is present.
There is fragmentation along the posterolateral wall without
inflammation of the infratemporal fat.

Sphenoid: Normally aerated. Patent sphenoethmoidal recesses.

Right ostiomeatal unit: Obstructed

Left ostiomeatal unit: Patent.

Nasal passages: Patent. Intact nasal septum is midline.

Anatomy: No pneumatization superior to anterior ethmoid notches.
Symmetric and intact olfactory grooves and fovea ethmoidalis, Keros
II (4-7mm) Presellar sphenoid pneumatization pattern. No dehiscence
of carotid or optic canals. No onodi cell.

Other: Orbits and intracranial compartment are unremarkable. Visible
mastoid air cells are normally aerated.
IMPRESSION: 1. Chronic obstruction of the right maxillary sinus with obstructive
mucosal thickening at the ostiomeatal complex.
2. Anterior right ethmoid air cells are also opacified.
3. No significant right frontal sinus disease is present.
4. Fragmentation of the posterolateral wall the right maxillary
sinus is likely secondary to chronic obstruction or remote trauma.
There is no hyperdense inflammation outside of the sinus.

## 2018-11-23 ENCOUNTER — Other Ambulatory Visit: Payer: Self-pay

## 2018-11-23 ENCOUNTER — Emergency Department (HOSPITAL_COMMUNITY)
Admission: EM | Admit: 2018-11-23 | Discharge: 2018-11-23 | Disposition: A | Discharge status: Home / Self Care | Payer: BLUE CROSS/BLUE SHIELD | Attending: Emergency Medicine | Admitting: Emergency Medicine

## 2018-11-23 ENCOUNTER — Encounter (HOSPITAL_COMMUNITY): Payer: Self-pay | Admitting: Emergency Medicine

## 2018-11-23 DIAGNOSIS — J029 Acute pharyngitis, unspecified: Secondary | ICD-10-CM

## 2018-11-23 DIAGNOSIS — B349 Viral infection, unspecified: Secondary | ICD-10-CM | POA: Diagnosis not present

## 2018-11-23 LAB — GROUP A STREP BY PCR: Group A Strep by PCR: NOT DETECTED

## 2018-11-23 MED ORDER — MAGIC MOUTHWASH W/LIDOCAINE: 5.0000 mL | Freq: Three times a day (TID) | ORAL | 0 refills | Status: AC | PRN

## 2018-11-23 MED ORDER — PROMETHAZINE-DM 6.25-15 MG/5ML PO SYRP: 5.0000 mL | ORAL_SOLUTION | Freq: Four times a day (QID) | ORAL | 0 refills | Status: AC | PRN

## 2018-11-23 NOTE — ED Provider Notes (Signed)
[facility] EMERGENCY DEPARTMENT Provider Note   CSN: [id number] Arrival date & time: 11/23/18  1516    History   Chief Complaint Chief Complaint  Patient presents with  . Influenza    HPI Juan Stewart is a 38 y.o. male.     HPI   Juan Stewart is a 38 y.o. male who presents to the Emergency Department complaining of sore throat, and cough.  symptoms have been present for four days.  Family members have also had similar symptoms.  He describes his cough as "dry" and forceful.  Sore throat developed after the cough.  He denies body aches, chills, fever, chest pain or shortness of breath.  He reports having intermittent "hot flashes" today while at work.  He tried to see his PCP today and was instructed to come to the ER for evaluation.  He has not tried any medications for his symptoms.  He also denies any recent travel.      Past Medical History:  Diagnosis Date  . 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 Maxillary sinusitis 03/2018  . Nasal turbinate hypertrophy 03/2018    There are no active problems to display for this patient.   Past Surgical History:  Procedure Laterality Date  . NASAL SINUS SURGERY Right 04/12/2018   Procedure: RIGHT ENDOSCOPIC ETHMOIDECTOMY AND MAXILLARY ANTROSTOMY WITH TISSUE REMOVAL;  Surgeon: Newman Pies, MD;  Location: [facility];  Service: ENT;  Laterality: Right;  . NO PAST SURGERIES    . SINUS ENDO WITH FUSION Right 04/12/2018   Procedure: SINUS ENDO WITH FUSION;  Surgeon: Newman Pies, MD;  Location: [facility];  Service: ENT;  Laterality: Right;  . TURBINATE REDUCTION Right 04/12/2018   Procedure: RIGHT TURBINATE REDUCTION;  Surgeon: Newman Pies, MD;  Location: [facility];  Service: ENT;  Laterality: Right;        Home Medications    Prior to Admission medications   Medication Sig Start Date End Date Taking? Authorizing Provider  oxyCODONE-acetaminophen (PERCOCET/ROXICET) 5-325 MG tablet Take 1  tablet by mouth every 4 (four) hours as needed for severe pain. 04/12/18   Newman Pies, MD    Family History Family History  Problem Relation Age of Onset  . Diabetes Father     Social History Social History   Tobacco Use  . Smoking status: Never Smoker  . Smokeless tobacco: Never Used  Substance Use Topics  . Alcohol use: Yes    Comment: weekends  . Drug use: No     Allergies   Patient has no known allergies.   Review of Systems Review of Systems  Constitutional: Negative for activity change, appetite change, chills and fever.       "hot flashes"  HENT: Positive for congestion and sore throat. Negative for facial swelling, rhinorrhea and trouble swallowing.   Eyes: Negative for visual disturbance.  Respiratory: Positive for cough. Negative for shortness of breath, wheezing and stridor.   Cardiovascular: Negative for chest pain.  Gastrointestinal: Negative for nausea and vomiting.  Genitourinary: Negative for decreased urine volume.  Musculoskeletal: Negative for arthralgias, myalgias, neck pain and neck stiffness.  Skin: Negative for rash.  Neurological: Negative for dizziness, weakness, numbness and headaches.  Hematological: Negative for adenopathy.  Psychiatric/Behavioral: Negative for confusion.     Physical Exam Updated Vital Signs BP (!) 146/99 (BP Location: Right Arm)   Pulse 91   Temp 98.5 F (36.9 C) (Oral)   Resp 18   Ht 6' (1.829 m)  Wt 93 kg   SpO2 99%   BMI 27.80 kg/m   Physical Exam Vitals signs and nursing note reviewed.  Constitutional:      Appearance: Normal appearance. He is not ill-appearing.  HENT:     Head: Atraumatic.     Right Ear: Tympanic membrane and ear canal normal.     Left Ear: Tympanic membrane and ear canal normal.     Nose: Nose normal.     Mouth/Throat:     Mouth: Mucous membranes are moist.     Pharynx: Oropharynx is clear. Uvula midline. Posterior oropharyngeal erythema present. No pharyngeal swelling, oropharyngeal  exudate or uvula swelling.     Tonsils: No tonsillar exudate or tonsillar abscesses.     Comments: Erythema of the oropharynx.  No edema or exudates.  Uvula is midline and non-edematous.   Neck:     Musculoskeletal: Normal range of motion. No neck rigidity or muscular tenderness.  Cardiovascular:     Rate and Rhythm: Normal rate and regular rhythm.     Pulses: Normal pulses.  Pulmonary:     Effort: Pulmonary effort is normal.     Breath sounds: Normal breath sounds. No wheezing or rhonchi.  Abdominal:     Palpations: Abdomen is soft. There is no mass.     Tenderness: There is no abdominal tenderness.  Musculoskeletal: Normal range of motion.  Skin:    General: Skin is warm.     Capillary Refill: Capillary refill takes less than 2 seconds.     Findings: No rash.  Neurological:     General: No focal deficit present.     Mental Status: He is alert.     Sensory: No sensory deficit.     Motor: No weakness.    `  ED Treatments / Results  Labs (all labs ordered are listed, but only abnormal results are displayed) Labs Reviewed  GROUP A STREP BY PCR    EKG None  Radiology No results found.  Procedures Procedures (including critical care time)  Medications Ordered in ED Medications - No data to display   Initial Impression / Assessment and Plan / ED Course  I have reviewed the triage vital signs and the nursing notes.  Pertinent labs & imaging results that were available during my care of the patient were reviewed by me and considered in my medical decision making (see chart for details).       Patient well-appearing.  Nontoxic.  Nonproductive cough and sore throat.  Endorses sick contacts at home with similar symptoms.  No history of recent travel, fever, or shortness of breath.  Strep testing negative.  No concerning symptoms for peritonsillar or retropharyngeal abscess.  I feel symptoms are likely viral.  He appears appropriate for discharge home and agrees to  treatment plan.   Final Clinical Impressions(s) / ED Diagnoses   Final diagnoses:  Pharyngitis with viral syndrome    ED Discharge Orders    None       Rosey Bath 11/23/18 1727    Eber Hong, MD 11/23/18 2329

## 2018-11-23 NOTE — Discharge Instructions (Addendum)
Drink plenty of fluids.  You may alternate Tylenol and ibuprofen every 4 and 6 hours if needed for pain or fever.  Follow-up with your primary doctor for recheck if needed.

## 2018-11-23 NOTE — ED Triage Notes (Signed)
Sore throat and cough since Friday.  Hot flashes since lunch time today.  Pt went to [facility] but was told he couldn't be seen today.

## 2019-06-29 DIAGNOSIS — Z6826 Body mass index (BMI) 26.0-26.9, adult: Secondary | ICD-10-CM | POA: Diagnosis not present

## 2019-06-29 DIAGNOSIS — Z13220 Encounter for screening for lipoid disorders: Secondary | ICD-10-CM | POA: Diagnosis not present

## 2019-06-29 DIAGNOSIS — Z0000 Encounter for general adult medical examination without abnormal findings: Secondary | ICD-10-CM | POA: Diagnosis not present

## 2019-06-29 DIAGNOSIS — Z131 Encounter for screening for diabetes mellitus: Secondary | ICD-10-CM | POA: Diagnosis not present

## 2019-07-23 DIAGNOSIS — R21 Rash and other nonspecific skin eruption: Secondary | ICD-10-CM | POA: Diagnosis not present

## 2019-10-10 ENCOUNTER — Other Ambulatory Visit: Payer: BLUE CROSS/BLUE SHIELD

## 2019-10-10 DIAGNOSIS — Z20828 Contact with and (suspected) exposure to other viral communicable diseases: Secondary | ICD-10-CM | POA: Diagnosis not present

## 2022-10-11 ENCOUNTER — Emergency Department (HOSPITAL_COMMUNITY): Payer: Managed Care, Other (non HMO)

## 2022-10-11 ENCOUNTER — Encounter (HOSPITAL_COMMUNITY): Payer: Self-pay

## 2022-10-11 ENCOUNTER — Other Ambulatory Visit: Payer: Self-pay

## 2022-10-11 ENCOUNTER — Emergency Department (HOSPITAL_COMMUNITY)
Admission: EM | Admit: 2022-10-11 | Discharge: 2022-10-11 | Disposition: A | Discharge status: Home / Self Care | Payer: Managed Care, Other (non HMO) | Attending: Emergency Medicine | Admitting: Emergency Medicine

## 2022-10-11 DIAGNOSIS — M545 Low back pain, unspecified: Secondary | ICD-10-CM | POA: Diagnosis present

## 2022-10-11 DIAGNOSIS — M5136 Other intervertebral disc degeneration, lumbar region: Secondary | ICD-10-CM

## 2022-10-11 DIAGNOSIS — M5441 Lumbago with sciatica, right side: Secondary | ICD-10-CM

## 2022-10-11 MED ORDER — NAPROXEN 500 MG PO TABS: 500.0000 mg | ORAL_TABLET | Freq: Two times a day (BID) | ORAL | 0 refills | Status: AC

## 2022-10-11 MED ORDER — HYDROMORPHONE HCL 1 MG/ML IJ SOLN
1.0000 mg | Freq: Once | INTRAMUSCULAR | Status: AC
  Administered 2022-10-11: 1 mg via INTRAVENOUS
  Filled 2022-10-11: qty 1

## 2022-10-11 MED ORDER — LIDOCAINE 5 % EX PTCH
1.0000 | MEDICATED_PATCH | CUTANEOUS | Status: DC
  Administered 2022-10-11: 1 via TRANSDERMAL
  Filled 2022-10-11: qty 1

## 2022-10-11 MED ORDER — DIAZEPAM 2 MG PO TABS
2.0000 mg | ORAL_TABLET | Freq: Once | ORAL | Status: AC
  Administered 2022-10-11: 2 mg via ORAL
  Filled 2022-10-11: qty 1

## 2022-10-11 MED ORDER — KETOROLAC TROMETHAMINE 15 MG/ML IJ SOLN: 15.0000 mg | Freq: Once | INTRAMUSCULAR | Status: DC

## 2022-10-11 MED ORDER — OXYCODONE-ACETAMINOPHEN 5-325 MG PO TABS: 1.0000 | ORAL_TABLET | Freq: Four times a day (QID) | ORAL | 0 refills | Status: AC | PRN

## 2022-10-11 MED ORDER — OXYCODONE-ACETAMINOPHEN 5-325 MG PO TABS: 1.0000 | ORAL_TABLET | Freq: Four times a day (QID) | ORAL | 0 refills | Status: DC | PRN

## 2022-10-11 MED ORDER — LORAZEPAM 2 MG/ML IJ SOLN
1.0000 mg | Freq: Once | INTRAMUSCULAR | Status: AC
  Administered 2022-10-11: 1 mg via INTRAVENOUS
  Filled 2022-10-11: qty 1

## 2022-10-11 MED ORDER — HYDROMORPHONE HCL 1 MG/ML IJ SOLN: 0.5000 mg | Freq: Once | INTRAMUSCULAR | Status: DC

## 2022-10-11 MED ORDER — CYCLOBENZAPRINE HCL 10 MG PO TABS: 10.0000 mg | ORAL_TABLET | Freq: Two times a day (BID) | ORAL | 0 refills | Status: DC | PRN

## 2022-10-11 MED ORDER — WALKER MISC: 1.0000 [IU] | Freq: Once | 0 refills | Status: AC

## 2022-10-11 MED ORDER — CYCLOBENZAPRINE HCL 10 MG PO TABS
10.0000 mg | ORAL_TABLET | Freq: Once | ORAL | Status: AC
  Administered 2022-10-11: 10 mg via ORAL
  Filled 2022-10-11: qty 1

## 2022-10-11 MED ORDER — HYDROMORPHONE HCL 1 MG/ML IJ SOLN
0.5000 mg | Freq: Once | INTRAMUSCULAR | Status: AC
  Administered 2022-10-11: 0.5 mg via INTRAVENOUS
  Filled 2022-10-11: qty 1

## 2022-10-11 MED ORDER — CYCLOBENZAPRINE HCL 10 MG PO TABS: 10.0000 mg | ORAL_TABLET | Freq: Two times a day (BID) | ORAL | 0 refills | Status: AC | PRN

## 2022-10-11 MED ORDER — DEXAMETHASONE SODIUM PHOSPHATE 10 MG/ML IJ SOLN
10.0000 mg | Freq: Once | INTRAMUSCULAR | Status: AC
  Administered 2022-10-11: 10 mg via INTRAVENOUS
  Filled 2022-10-11: qty 1

## 2022-10-11 MED ORDER — NAPROXEN 500 MG PO TABS: 500.0000 mg | ORAL_TABLET | Freq: Two times a day (BID) | ORAL | 0 refills | Status: DC

## 2022-10-11 MED ORDER — KETOROLAC TROMETHAMINE 60 MG/2ML IM SOLN
15.0000 mg | Freq: Once | INTRAMUSCULAR | Status: AC
  Administered 2022-10-11: 15 mg via INTRAMUSCULAR
  Filled 2022-10-11: qty 2

## 2022-10-11 MED ORDER — OXYCODONE-ACETAMINOPHEN 5-325 MG PO TABS
1.0000 | ORAL_TABLET | Freq: Once | ORAL | Status: AC
  Administered 2022-10-11: 1 via ORAL
  Filled 2022-10-11: qty 1

## 2022-10-11 NOTE — ED Notes (Signed)
Patient in MRI 

## 2022-10-11 NOTE — Discharge Instructions (Addendum)
Have a couple of bulging disks in your back causing your pain.  You are given medication for pain and muscle relaxers.  Follow-up with your doctor, come back to the ER for new or worsening symptoms especially if you have numbness or tingling in your groin area or loss of control of your bowels or bladder.  Pick up an over-the-counter TENS unit to help with your pain as well

## 2022-10-11 NOTE — ED Provider Notes (Signed)
[facility] EMERGENCY DEPARTMENT AT [facility] Provider Note   CSN: [id number] Arrival date & time: 10/11/22  1425     History  Chief Complaint  Patient presents with   Back Pain    Juan Stewart is a 42 y.o. male.  Denies any past medical history.  He presents the ER complaining of right low back pain shooting down the right buttock and into the right foot.  This started today while he was at the circus with his children.  He picked up his daughter to sit her on her chair and felt sharp pain from his right lumbar area shoot up his right back and it had gotten slightly better but when he stood up later on the pain started shooting down the right leg and he is unable to bear his own weight.  He tried to get into his truck to be able to go home and lay down but could not get into his truck or his sister's vehicle which was lower so comes into the ED for further evaluation.  He denies saddle anesthesia or paresthesia, no bowel or bladder incontinence.  No fevers or chills, has never had this in the past, he denies any trauma to his back.   Back Pain      Home Medications Prior to Admission medications   Medication Sig Start Date End Date Taking? Authorizing Provider  magic mouthwash w/lidocaine SOLN Take 5 mLs by mouth 3 (three) times daily as needed for mouth pain. Swish and spit, do not swallow 11/23/18   Triplett, Tammy, PA-C  oxyCODONE-acetaminophen (PERCOCET/ROXICET) 5-325 MG tablet Take 1 tablet by mouth every 4 (four) hours as needed for severe pain. 04/12/18   Leta Baptist, MD  promethazine-dextromethorphan (PROMETHAZINE-DM) 6.25-15 MG/5ML syrup Take 5 mLs by mouth 4 (four) times daily as needed. 11/23/18   Triplett, Tammy, PA-C      Allergies    Patient has no known allergies.    Review of Systems   Review of Systems  Musculoskeletal:  Positive for back pain.    Physical Exam Updated Vital Signs BP (!) 154/78   Pulse 82   Temp (!) 97.5 F (36.4 C) (Oral)    Resp 19   SpO2 98%  Physical Exam Constitutional:      Appearance: Normal appearance.  HENT:     Head: Normocephalic and atraumatic.  Cardiovascular:     Rate and Rhythm: Normal rate and regular rhythm.  Pulmonary:     Effort: Pulmonary effort is normal.     Breath sounds: Normal breath sounds.  Abdominal:     General: There is no distension.     Palpations: Abdomen is soft.     Tenderness: There is no abdominal tenderness.  Musculoskeletal:        General: No swelling.     Cervical back: Normal and normal range of motion.     Thoracic back: Normal.     Lumbar back: Tenderness present. No bony tenderness. Negative right straight leg raise test and negative left straight leg raise test.     Right lower leg: No edema.     Left lower leg: No edema.  Skin:    General: Skin is warm and dry.  Neurological:     Mental Status: He is alert.  Psychiatric:        Mood and Affect: Mood normal.     ED Results / Procedures / Treatments   Labs (all labs ordered are listed, but only abnormal  results are displayed) Labs Reviewed - No data to display  EKG None  Radiology No results found.  Procedures Procedures    Medications Ordered in ED Medications  cyclobenzaprine (FLEXERIL) tablet 10 mg (10 mg Oral Given 10/11/22 1453)  ketorolac (TORADOL) injection 15 mg (15 mg Intramuscular Given 10/11/22 1453)    ED Course/ Medical Decision Making/ A&P Clinical Course as of 10/11/22 2013  Sat Oct 11, 2022  1718 Had no improvement with Toradol Flexeril and Percocet given Dilaudid and Valium the pain is out of 5 will give additional dose of Dilaudid at this time and some dexamethasone.  [CB]  1820 Patient is feeling some improvement after the second dose of Dilaudid but is still pain with any movement [CB]    Clinical Course User Index [CB] Gwenevere Abbot, PA-C                             Medical Decision Making This patient presents to the ED for concern of low back pain, this  involves an extensive number of treatment options, and is a complaint that carries with it a high risk of complications and morbidity.  The differential diagnosis includes HNP, muscle strain, discitis, equina, radiculopathy, other   Additional history obtained:  Additional history obtained from EMR External records from outside source obtained and reviewed including previous ED visits      Imaging Studies ordered:  I ordered imaging studies including MRI I independently visualized and interpreted imaging which showed small central disc protrusions L4-L5 and L5-S1 I agree with the radiologist interpretation     Problem List / ED Course / Critical interventions / Medication management  Low back pain with pain radiating to the right leg and sciatic distribution.  There are no signs or symptoms of cord compression.  This was an acute injury when lifting his child up with no trauma, he has no neurologic symptoms, no urinary symptoms, no weight loss, no fevers or other infectious symptoms, he does not have history of drug use or immunosuppression.  No indication this time for further evaluation plan to control pain and if he is able to ambulate will be discharged with analgesics and outpatient follow-up. I ordered medication including Toradol, Flexeril and Percocet for pain Reevaluation of the patient after these medicines showed that the patient stayed the same, feeling better after several doses of Dilaudid I have reviewed the patients home medicines and have made adjustments as needed        Amount and/or Complexity of Data Reviewed Radiology: ordered.  Risk Prescription drug management.           Final Clinical Impression(s) / ED Diagnoses Final diagnoses:  None    Rx / DC Orders ED Discharge Orders     None         Darci Current 10/11/22 2130    Isla Pence, MD 10/11/22 2235

## 2022-10-11 NOTE — ED Triage Notes (Signed)
Patient bent down to pick his kid up and tweaked his back. Sharp stabbing pain to his right lower back that shoots down his right leg. Unable to sit up.

## 2022-10-11 NOTE — ED Notes (Signed)
ED Provider at bedside.
